# Patient Record
Sex: Male | Born: 1935 | Race: White | Hispanic: No
Health system: Southern US, Community
[De-identification: ages and names within clinical notes are randomized; demographics above are authoritative.]

---

## 2010-11-19 ENCOUNTER — Emergency Department: Payer: Self-pay | Admitting: Emergency Medicine

## 2012-01-07 ENCOUNTER — Emergency Department: Payer: Self-pay | Admitting: Emergency Medicine

## 2012-01-07 LAB — COMPREHENSIVE METABOLIC PANEL
Alkaline Phosphatase: 72 U/L (ref 50–136)
Bilirubin,Total: 0.5 mg/dL (ref 0.2–1.0)
Calcium, Total: 8.6 mg/dL (ref 8.5–10.1)
Chloride: 106 mmol/L (ref 98–107)
Co2: 28 mmol/L (ref 21–32)
Creatinine: 1.47 mg/dL — ABNORMAL HIGH (ref 0.60–1.30)
EGFR (African American): 46 — ABNORMAL LOW
EGFR (Non-African Amer.): 40 — ABNORMAL LOW
SGPT (ALT): 18 U/L (ref 12–78)
Total Protein: 7.3 g/dL (ref 6.4–8.2)

## 2012-01-07 LAB — CBC
HGB: 12.2 g/dL — ABNORMAL LOW (ref 13.0–18.0)
MCHC: 33.5 g/dL (ref 32.0–36.0)
Platelet: 182 10*3/uL (ref 150–440)
RBC: 4.16 10*6/uL — ABNORMAL LOW (ref 4.40–5.90)
RDW: 14.7 % — ABNORMAL HIGH (ref 11.5–14.5)

## 2012-01-07 LAB — CK TOTAL AND CKMB (NOT AT ARMC): CK, Total: 52 U/L (ref 35–232)

## 2012-12-30 ENCOUNTER — Inpatient Hospital Stay: Payer: Self-pay | Admitting: Family Medicine

## 2012-12-30 LAB — URINALYSIS, COMPLETE
Bilirubin,UR: NEGATIVE
Glucose,UR: NEGATIVE mg/dL (ref 0–75)
Ketone: NEGATIVE
Nitrite: POSITIVE
Protein: 30
WBC UR: 2 /HPF (ref 0–5)

## 2012-12-30 LAB — PRO B NATRIURETIC PEPTIDE: B-Type Natriuretic Peptide: 1674 pg/mL — ABNORMAL HIGH (ref 0–450)

## 2012-12-30 LAB — CBC WITH DIFFERENTIAL/PLATELET
Basophil #: 0 10*3/uL (ref 0.0–0.1)
Eosinophil #: 0.1 10*3/uL (ref 0.0–0.7)
Lymphocyte #: 0.6 10*3/uL — ABNORMAL LOW (ref 1.0–3.6)
MCHC: 32.6 g/dL (ref 32.0–36.0)
Monocyte #: 1 x10 3/mm (ref 0.2–1.0)
Neutrophil #: 12.2 10*3/uL — ABNORMAL HIGH (ref 1.4–6.5)
RDW: 15.4 % — ABNORMAL HIGH (ref 11.5–14.5)

## 2012-12-30 LAB — MAGNESIUM: Magnesium: 1.6 mg/dL — ABNORMAL LOW

## 2012-12-30 LAB — COMPREHENSIVE METABOLIC PANEL
Albumin: 2.7 g/dL — ABNORMAL LOW (ref 3.4–5.0)
Alkaline Phosphatase: 76 U/L (ref 50–136)
Anion Gap: 6 — ABNORMAL LOW (ref 7–16)
BUN: 25 mg/dL — ABNORMAL HIGH (ref 7–18)
Bilirubin,Total: 0.5 mg/dL (ref 0.2–1.0)
Chloride: 108 mmol/L — ABNORMAL HIGH (ref 98–107)
Co2: 26 mmol/L (ref 21–32)
EGFR (African American): 35 — ABNORMAL LOW
EGFR (Non-African Amer.): 30 — ABNORMAL LOW
Glucose: 125 mg/dL — ABNORMAL HIGH (ref 65–99)
SGPT (ALT): 25 U/L (ref 12–78)
Total Protein: 6.7 g/dL (ref 6.4–8.2)

## 2012-12-30 LAB — PROTIME-INR: Prothrombin Time: 14.5 secs (ref 11.5–14.7)

## 2012-12-30 LAB — TROPONIN I
Troponin-I: 0.29 ng/mL — ABNORMAL HIGH
Troponin-I: 0.99 ng/mL — ABNORMAL HIGH
Troponin-I: 0.87 ng/mL — ABNORMAL HIGH

## 2012-12-31 LAB — BASIC METABOLIC PANEL
Anion Gap: 7 (ref 7–16)
BUN: 24 mg/dL — ABNORMAL HIGH (ref 7–18)
Calcium, Total: 8.2 mg/dL — ABNORMAL LOW (ref 8.5–10.1)
Chloride: 111 mmol/L — ABNORMAL HIGH (ref 98–107)
EGFR (African American): 38 — ABNORMAL LOW
EGFR (Non-African Amer.): 33 — ABNORMAL LOW
Glucose: 87 mg/dL (ref 65–99)
Osmolality: 285 (ref 275–301)
Potassium: 4.2 mmol/L (ref 3.5–5.1)

## 2012-12-31 LAB — CBC WITH DIFFERENTIAL/PLATELET
Basophil #: 0.1 10*3/uL (ref 0.0–0.1)
Basophil %: 0.8 %
Eosinophil #: 0.7 10*3/uL (ref 0.0–0.7)
HGB: 9.7 g/dL — ABNORMAL LOW (ref 13.0–18.0)
Lymphocyte #: 1.3 10*3/uL (ref 1.0–3.6)
MCH: 28.4 pg (ref 26.0–34.0)
MCHC: 33.4 g/dL (ref 32.0–36.0)
Monocyte #: 0.7 x10 3/mm (ref 0.2–1.0)
Neutrophil #: 6.3 10*3/uL (ref 1.4–6.5)
Platelet: 159 10*3/uL (ref 150–440)
RBC: 3.43 10*6/uL — ABNORMAL LOW (ref 4.40–5.90)
RDW: 16 % — ABNORMAL HIGH (ref 11.5–14.5)
WBC: 8.9 10*3/uL (ref 3.8–10.6)

## 2012-12-31 LAB — LIPID PANEL
Cholesterol: 109 mg/dL (ref 0–200)
Triglycerides: 55 mg/dL (ref 0–200)

## 2013-01-01 LAB — BASIC METABOLIC PANEL
BUN: 27 mg/dL — ABNORMAL HIGH (ref 7–18)
Calcium, Total: 8.1 mg/dL — ABNORMAL LOW (ref 8.5–10.1)
Chloride: 111 mmol/L — ABNORMAL HIGH (ref 98–107)
Creatinine: 1.81 mg/dL — ABNORMAL HIGH (ref 0.60–1.30)
EGFR (African American): 36 — ABNORMAL LOW
Glucose: 82 mg/dL (ref 65–99)
Sodium: 141 mmol/L (ref 136–145)

## 2013-01-01 LAB — HEMOGLOBIN: HGB: 9.5 g/dL — ABNORMAL LOW (ref 13.0–18.0)

## 2013-01-02 LAB — BASIC METABOLIC PANEL
Anion Gap: 4 — ABNORMAL LOW (ref 7–16)
BUN: 23 mg/dL — ABNORMAL HIGH (ref 7–18)
Chloride: 114 mmol/L — ABNORMAL HIGH (ref 98–107)
Co2: 24 mmol/L (ref 21–32)
Creatinine: 1.49 mg/dL — ABNORMAL HIGH (ref 0.60–1.30)
Glucose: 89 mg/dL (ref 65–99)
Osmolality: 286 (ref 275–301)

## 2013-01-03 LAB — BASIC METABOLIC PANEL
BUN: 20 mg/dL — ABNORMAL HIGH (ref 7–18)
Co2: 25 mmol/L (ref 21–32)
Creatinine: 1.46 mg/dL — ABNORMAL HIGH (ref 0.60–1.30)
EGFR (African American): 46 — ABNORMAL LOW
EGFR (Non-African Amer.): 40 — ABNORMAL LOW
Glucose: 93 mg/dL (ref 65–99)
Osmolality: 285 (ref 275–301)
Sodium: 142 mmol/L (ref 136–145)

## 2013-01-05 ENCOUNTER — Ambulatory Visit: Payer: Self-pay | Admitting: Nurse Practitioner

## 2013-01-10 ENCOUNTER — Emergency Department: Payer: Self-pay | Admitting: Emergency Medicine

## 2013-01-10 ENCOUNTER — Ambulatory Visit: Payer: Self-pay | Admitting: Internal Medicine

## 2013-01-10 LAB — COMPREHENSIVE METABOLIC PANEL
Albumin: 2.5 g/dL — ABNORMAL LOW (ref 3.4–5.0)
Alkaline Phosphatase: 134 U/L (ref 50–136)
BUN: 17 mg/dL (ref 7–18)
Bilirubin,Total: 0.4 mg/dL (ref 0.2–1.0)
Calcium, Total: 8.5 mg/dL (ref 8.5–10.1)
Co2: 29 mmol/L (ref 21–32)
Creatinine: 1.44 mg/dL — ABNORMAL HIGH (ref 0.60–1.30)
Glucose: 96 mg/dL (ref 65–99)
Potassium: 4.6 mmol/L (ref 3.5–5.1)
Sodium: 136 mmol/L (ref 136–145)
Total Protein: 6.1 g/dL — ABNORMAL LOW (ref 6.4–8.2)

## 2013-01-10 LAB — CBC
HCT: 30.5 % — ABNORMAL LOW (ref 40.0–52.0)
HGB: 10.2 g/dL — ABNORMAL LOW (ref 13.0–18.0)
MCH: 27.6 pg (ref 26.0–34.0)
MCV: 82 fL (ref 80–100)
Platelet: 263 10*3/uL (ref 150–440)
RBC: 3.71 10*6/uL — ABNORMAL LOW (ref 4.40–5.90)
RDW: 16.3 % — ABNORMAL HIGH (ref 11.5–14.5)

## 2013-02-04 ENCOUNTER — Ambulatory Visit: Payer: Self-pay | Admitting: Nurse Practitioner

## 2013-05-05 ENCOUNTER — Ambulatory Visit: Payer: Self-pay | Admitting: Nurse Practitioner

## 2013-05-05 DEATH — deceased

## 2014-06-27 NOTE — Discharge Summary (Signed)
PATIENT NAMOretha Caprice:  Marlar, Haytham MR#:  [id number] DATE OF BIRTH:  07/15/1913  DATE OF ADMISSION:  12/30/2012    Addendum  DISCHARGE DIAGNOSES: 1.  Acute renal failure secondary to acute tubular necrosis.  2.  Acute respiratory failure secondary to underlying chronic obstructive pulmonary disease that is improved.  3.  History of accelerated hypertension that is improved.    ____________________________ Marisue IvanKanhka Jasmon Mattice, MD kl:dmm D: 01/03/2013 11:54:16 ET T: 01/03/2013 11:59:43 ET JOB#: [id number]  cc: Marisue IvanKanhka Sevilla Murtagh, MD, <Dictator> Marisue IvanKANHKA Marivel Mcclarty MD ELECTRONICALLY SIGNED 01/15/2013 16:54

## 2014-06-27 NOTE — Discharge Summary (Signed)
PATIENT NAMOretha Graham:  Graham, [name] MR#:  [id number] DATE OF BIRTH:  06/03/1913  DATE OF ADMISSION:  12/30/2012 DATE OF DISCHARGE: 01/03/2013   DISCHARGE DIAGNOSES:  1.  Intracranial head bleed, status post fall. 2.  Hypertension.  3.  Acute renal failure.  4.  Chronic obstructive pulmonary disease/pulmonary fibrosis.  5.  Gastroesophageal reflux disease.  6.  Anxiety.   DISCHARGE MEDICATIONS:  1.  Paxil 20 mg p.o. at bedtime.  2.  Tylenol extra strength 500 mg 2 tabs q.6 hours as needed not to exceed 4 grams in 1 day.  3.  Prilosec 20 mg p.o. b.i.d.  4.  Carvedilol 3.125 mg p.o. b.i.d.  5.  Losartan 25 mg p.o. b.i.d.   CONSULTS: None.   PROCEDURES: None.   PERTINENT LABORATORY AND STUDIES: Head CT showed no acute changes from initial intracranial bleed on the right pons upon admission. Echocardiogram within normal limits. On the day of discharge, hemoglobin 9.3, sodium 142, potassium 3.9, creatinine 1.46, glucose of 93. Modified barium swallow study did show some deficiencies. Recommend a pureed diet per speech therapy. The patient was evaluated.   BRIEF HOSPITAL COURSE:  1.  Intracranial bleed. The patient initially came in status post fall and was found to have an intracranial bleed to the right pons seen on a head CT. Neurologically stable. Hemoglobin stable. Repeat CT of the head showed no significant changes from prior study. Plan to provide supportive care per family request. No invasive intervention. The patient's family denied transfer to a different facility for neurosurgery. Our plan is to repeat a CT scan in a week unless the patient has acute neurological deficits and then we will repeat a CT or MRI immediately. Plan to control the blood pressure as best as possible. We restarted his home regimen. We will keep his blood pressure less than 160/90.  2.  Risk for aspiration pneumonia. The patient initially came with concern for aspiration pneumonia. He was placed on IV antibiotics,  azithromycin and ceftriaxone. Repeat chest x-ray did not show any signs of pneumonia. The patient did not complain of any cough or worsening dyspnea during hospital stay. He does have underlying COPD and pulmonary fibrosis; therefore, the antibiotics were discontinued after 3 days of treatment, which is as per home regimen.  3.  GERD. Continue his home regimen of Prilosec.   DISPOSITION: The patient is in stable condition and is to be discharged to a skilled nursing facility where he will need further nursing care, PT and OT. The patient is not safe to return home. He will follow-up with Dr. Burnadette PopLinthavong within 1 to 2 days of discharge from the skilled nursing facility.  ____________________________ [name] IvanKanhka Misha Antonini, MD kl:aw D: 01/03/2013 08:24:02 ET T: 01/03/2013 08:52:38 ET JOB#: [id number]  cc: [name] IvanKanhka Allisyn Kunz, MD, <Dictator> [name] IvanKANHKA Eliab Closson MD ELECTRONICALLY SIGNED 01/15/2013 16:54

## 2014-06-27 NOTE — H&P (Signed)
[name], [name] MR#:  [id number] DATE OF BIRTH:  [birth year]  DATE OF ADMISSION:  12/30/2012  REASON FOR ADMISSION: Acute respiratory failure, intracranial bleeding.   PRIMARY CARE PHYSICIAN: Marisue Ivan, MD    REFERRING PHYSICIAN: Dr. Bayard Males  HISTORY OF PRESENT ILLNESS: This is a very nice 81 year old gentleman with history of hypertension, GERD, previous prostate cancer, benign paroxysmal vertigo, who comes after a fall at 10:30 p.m. last night. The patient was getting up going to the bathroom and fell straight back. He hit his head against the doorknob and his shoulder as well. He could not get up. The family was concerned and the patient started developing acute shortness of breath at that moment. They brought him to the Emergency Department where he was evaluated. CT of the head showed intracerebral bleeding on the right pons. The patient has not had any significant changes in his mental status. He was very weak and dehydrated, received some IV fluids and has started feeling better. As for his general health, the patient has had a significant decline with the past two months, not eating or drinking much. He does not have any dementia. He is actually very with it mentally. He has been declining his motor abilities; now he has to ambulate with a walker. The patient apparently has been getting really short of breath whenever he moves from his bedroom to the bathroom, and then from the bathroom to the kitchen he has to stop several times on the way to make sure that he catches his air.   Overall, the patient has been dealing with issues of blood pressure, but his general state of health has been good up until the last couple of months. Apparently, he has had at least a 10 pound decrease of weight. He has not been eating or drinking much.   The patient is admitted for observation and  progression of this bleeding, and also for treatment of his acute respiratory failure likely  secondary to pneumonitis/pneumonia. The patient's family is okay with the patient not being transferred and staying here mostly for not aggressive treatment. The patient and the family are aware of the level of care that we can provide over here.   PAST MEDICAL HISTORY:  1. Hypertension.  2. GERD.  3. Hiatal hernia.  4. Dysphagia.  5. Prostate cancer.  6. Benign positional vertigo.  7. Macular degeneration, legally blind.   PAST SURGICAL HISTORY:  1. Hiatal hernia repair.  2. Radical prostatectomy 30 years ago.  3. Back surgery x 2.   ALLERGIES: No known drug allergies.   FAMILY HISTORY: Unknown. The patient states that most of his family died from old age.   SOCIAL HISTORY: Used to smoke. Quit over 30 years ago. He used to drink heavily, but quit over 30 years ago. He lives at his own house, mostly by himself with 24 hours/7 help of his family or his caregivers.   MEDICATIONS: Prilosec 20 mg twice daily, losartan 25 mg twice daily, meclizine 25 mg twice daily, Coreg 3.125 mg twice daily, paroxetine 40 mg at night.    PHYSICAL EXAMINATION: VITAL SIGNS: Blood pressure 157/69, pulse 81 respiratory rate 25 to 30, temperature 99.2. Oxygen saturation 88% on nasal cannula, now oxygenating 98% on non-rebreather.  GENERAL: The patient is awake, oriented x 3, in no acute distress. Positive respiratory distress. Hemodynamically he is stable, looks dehydrated.  HEENT: Pupils are equal and reactive. Extraocular movements are intact. Mucosa moist. Anicteric sclerae. Pink conjunctivae. No  oral lesions. No oropharyngeal exudates.  NECK: Supple. No JVD. No thyromegaly. No adenopathy. No carotid bruits. No rigidity.  CARDIOVASCULAR: Regular rate and rhythm. No murmurs, rubs or gallops are appreciated.  LUNGS: Coarse with crackles on both bases and lateral rhonchi. Positive use of accessory muscles. No dullness to percussion.  ABDOMEN: Soft, nontender, nondistended. No hepatosplenomegaly. No masses.  Bowel sounds are positive.  GENITAL: Negative for external lesions.    EXTREMITIES: No edema, cyanosis or clubbing. Pulses +2. Capillary refill about 5 seconds.  NEUROLOGIC: Cranial nerves II through XII intact. Strength is 4/5 all four extremities. No focal findings. No findings of increased intracranial or intracerebral pressure.  PSYCHIATRIC: The patient looks oriented. He is aware of the situation. No agitation.  LYMPHATIC: Negative for lymphadenopathy in neck or supraclavicular areas.  SKIN: Decreased turgor, slightly dehydrated.  MUSCULOSKELETAL: No significant joint effusions or joint deformity.  RESULTS: CT scan mentioned above.   Chest x-ray: Bilateral diffuse infiltrates.   Creatinine is 1.85, previous creatinine 1.4 one year ago. Glucose 125, potassium 4.4.  Troponin is 0.29, white blood count is 13.8, hemoglobin is 10, platelets 185. The patient does not have a PT/INR; I am going to get one STAT. His magnesium is 1.6.  Urinalysis is back. White count is 2,  no red blood cells, positive nitrates, but not really signs of infection.   EKG: Normal sinus rhythm   ASSESSMENT AND PLAN: An 81 year old gentleman with hypertension, GERD, who comes after fall, presenting with intracerebral hemorrhage and acute respiratory failure.  1.  Status post fall. The patient has been very weak, declining significantly, and we are going to get physical therapy and occupational therapy evaluation.  2. Intracerebral bleeding. This is the main problem that he has right now. He has a pons hemorrhage. We need to follow up with CT scan. We are going to order one for tomorrow morning.  The patient's family does not want any aggressive treatment, for what I think 24 hours for now repeated CT scan should be appropriate unless there is any changes on his mental status or neurologic exam. If that changes, we are going to do a STAT CT, but for now we are just going to repeat tomorrow for evaluation of the progression.  There are no significant signs of increased intracerebral pressure, for what we do not have to be aggressive in management. His blood pressure should be kept below a mean arterial pressure (MAP) of 110, below systolic of 160 over 90s. Add on labetalol and hydralazine p.r.n. Prevent hyperglycemia. Use of isotonic saline to prevent cerebral edema. Do not use hypotonic solutions. Monitor electrolytes closely. The patient does not need to be transferred to another facility, as the family is requesting non-aggressive care and only expectant management. Neuro checks q.4 hours.  3. Hypertension. Continue management with Coreg. Hold losartan as the patient is in acute  kidney failure. See above parameters. 4. Acute kidney injury. The patient has chronic kidney disease. Last creatinine was 1.47. His GFR is  40. Today his GFR is 30 with a creatinine of 1.85. We are going to hydrate.  5. Acute respiratory failure. The patient is likely having a pneumonitis versus new beginnings of pneumonia. The patient has a fever, has a respiratory rate of 30. His white count is slightly elevated at 13. We are going to treat with antibiotics. He was started on Levaquin. Since has presentation with acute kidney failure, I am going to change his antibiotics to Rocephin and azithromycin. The patient  needs blood cultures before the antibiotics.  6. Check INR to correct any coagulopathy if necessary.  7. Dysphasia,  speech therapy.  8. Gastroesophageal reflux disease.  Continue proton pump inhibitor.  9. Increased BNP. Consider this as a possibility of congestive heart failure. We are going to get an echo. At this moment I am not clear if he is in full congestive heart failure. The patient definitely is dehydrated, for what I think he needs fluids. Monitor closely. Use Lasix if necessary.  10. Hypomagnesemia.   11. Increased troponin, likely secondary to intracranial bleeding. No signs of ST depression or elevation. Follow up cardiac  enzymes. Consult cardiology if necessary.  12. The patient is a DO NOT RESUSCITATE.   TIME SPENT: I spent about 60 minutes with this patient in critical care time, as he has significant problems including respiratory failure, intracerebral bleeding followed by significant concerns for cardiovascular collapse, respiratory failure, cardiac arrest and cerebral edema.   We transferred the care to Dr. Burnadette Pop.    ____________________________ Felipa Furnace, MD rsg:sg D: 12/30/2012 08:33:00 ET T: 12/30/2012 09:08:48 ET JOB#: [id number]  cc: Felipa Furnace, MD, <Dictator> Olando Willems Juanda Chance MD ELECTRONICALLY SIGNED 01/18/2013 23:46

## 2015-04-19 IMAGING — CT CT CERVICAL SPINE WITHOUT CONTRAST
1 series · 12 of 14 positions shown, 15 images · non-contrast
Comparison: none

REASON FOR EXAM: fall with occipital trauma
COMMENTS:

PROCEDURE:     CT  - CT CERVICAL SPINE WO  - December 30, 2012  [DATE]
RESULT:     CT cervical spine 12/30/2012
Comparison 01/08/2012
TECHNIQUE: Helical noncontrasted 2 mm sections were obtained. Multiplanar
reconstructions utilizing high-definition bone algorithm.

[Series 4: axial · axial · 0.33mm/px · z∈[-295,-146]mm · 12 of 99 slices shown, 15 images]
[im 8/99  soft-tissue]
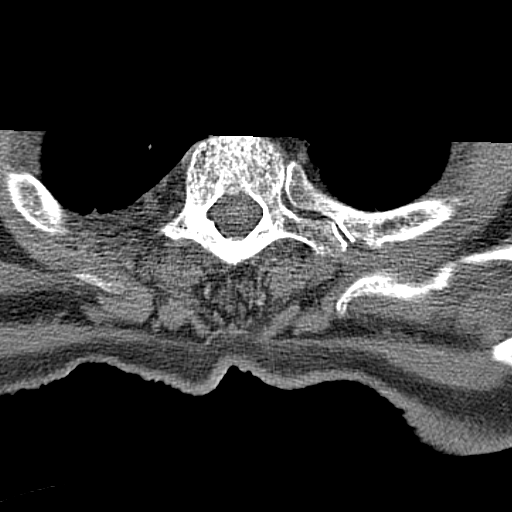
[im 8/99  bone]
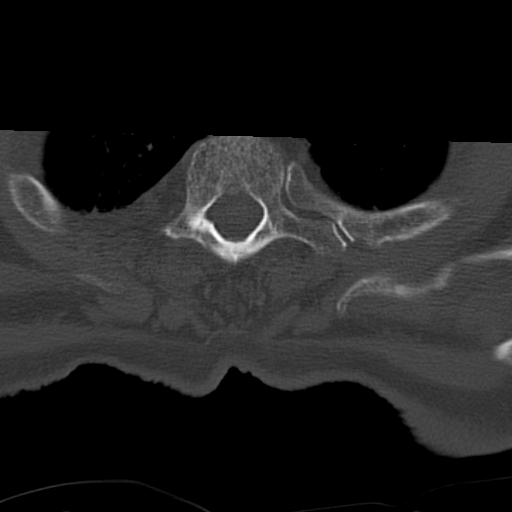
[im 16/99  bone]
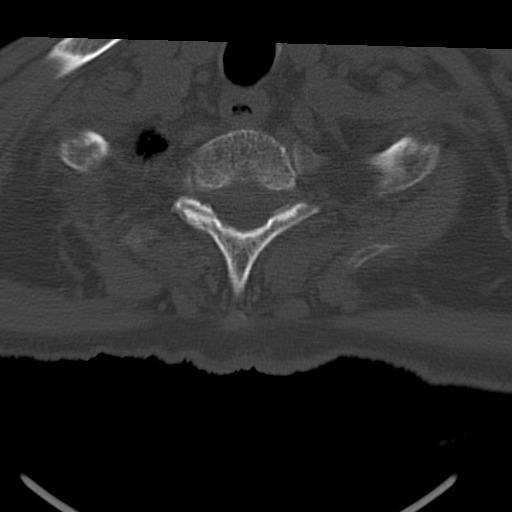
[im 23/99  bone]
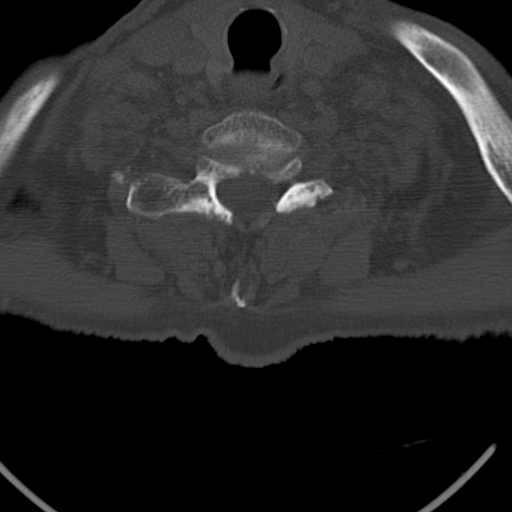
[im 31/99  bone]
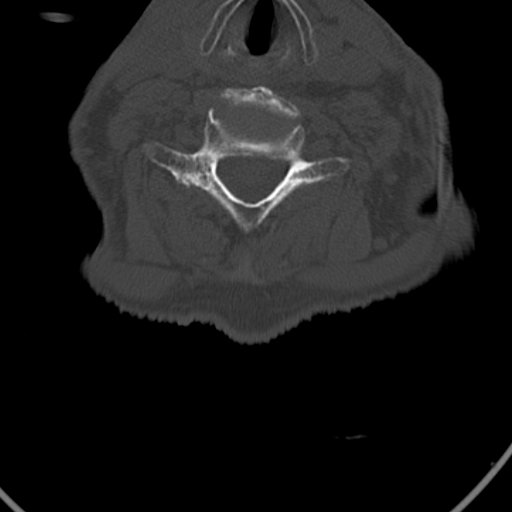
[im 38/99  soft-tissue]
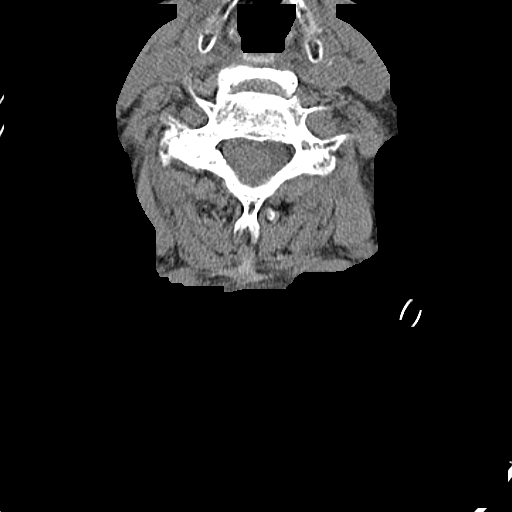
[im 38/99  bone]
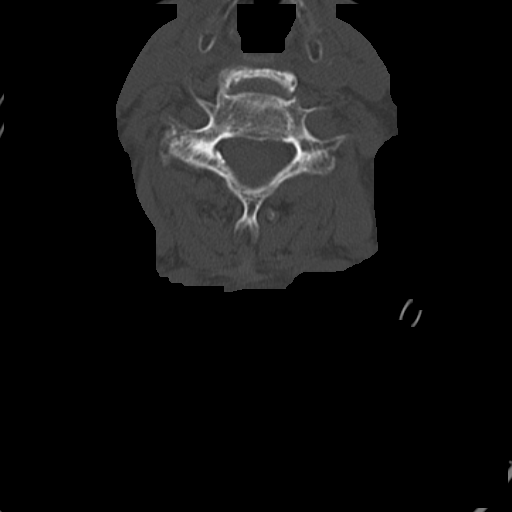
[im 46/99  bone]
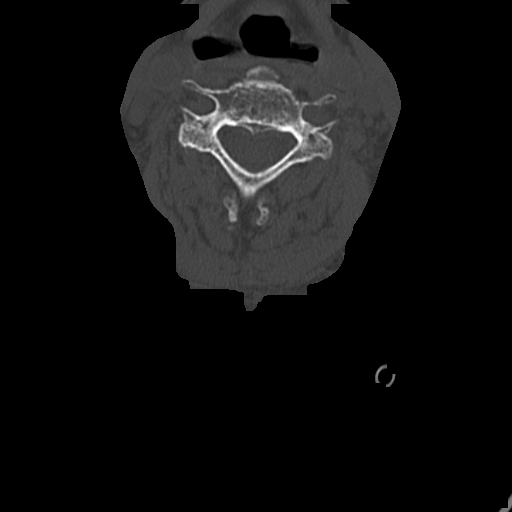
[im 53/99  bone]
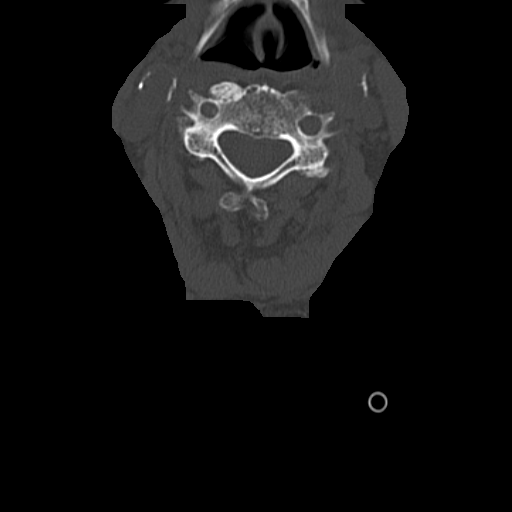
[im 61/99  bone]
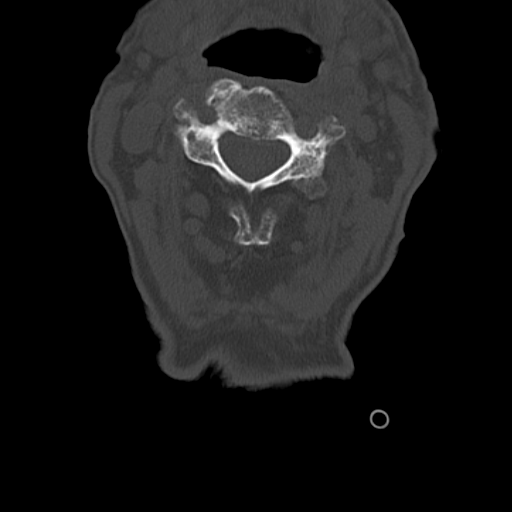
[im 68/99  soft-tissue]
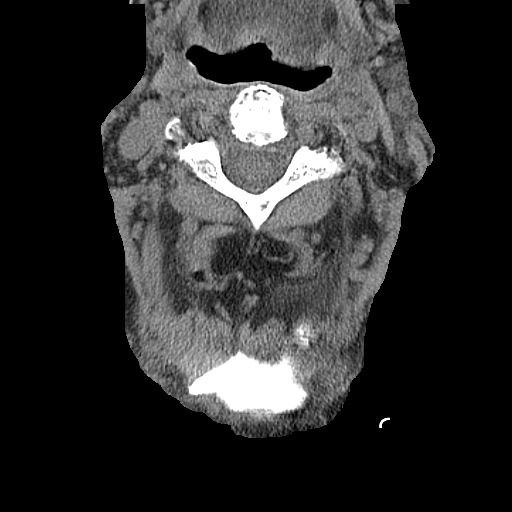
[im 68/99  bone]
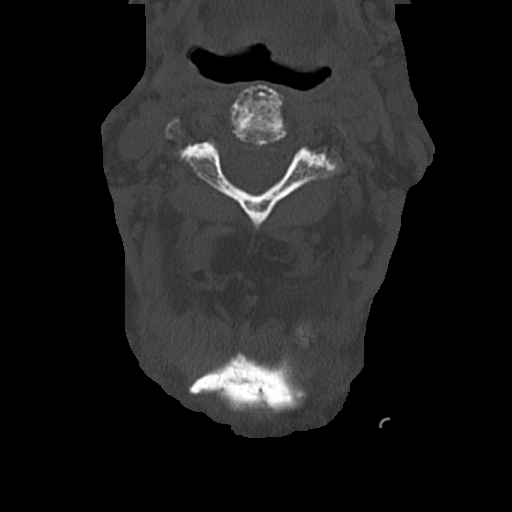
[im 76/99  bone]
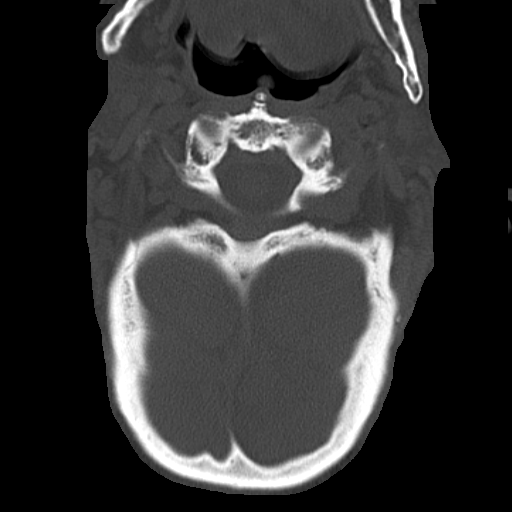
[im 83/99  bone]
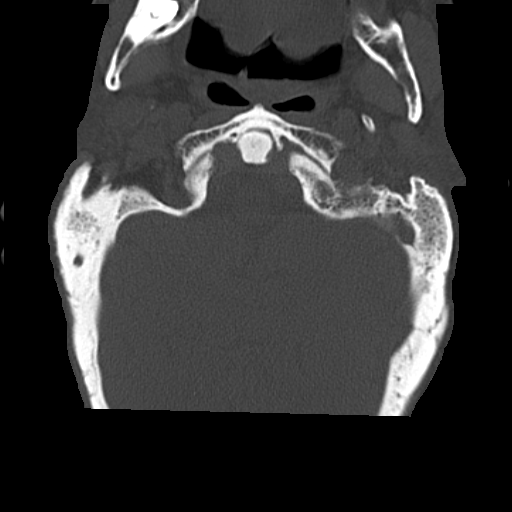
[im 91/99  bone]
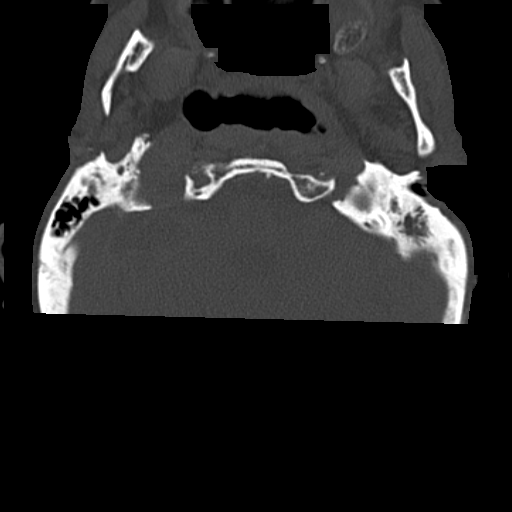

[12 of 14 positions shown; findings below may reference images not displayed]

FINDINGS: Severe degenerative changes are appreciated at the atlanto-dens
articulation. There is no evidence of acute fracture or dislocation.
Moderate multilevel degenerative changes. No evidence of canal stenosis.
There is no evidence of soft tissue swelling in the prevertebral region.
Diffuse idiopathic skeletal hyperostosis is appreciated.
IMPRESSION: No evidence of acute osseous maladies.
2. Dr. Rinnah of the emergency department was informed of these findings per
a preliminary faxed report.

## 2015-04-30 IMAGING — US US EXTREM UP VENOUS*R*
1 series · 14 of 24 positions shown · non-contrast
Comparison: None.

CLINICAL DATA: Right upper extremity swelling after intravenous
line placement

EXAM:
US EXTREM UP VENOUS RIGHT
TECHNIQUE: Gray-scale sonography with graded compression, as well as color
Doppler and duplex ultrasound were performed to evaluate the upper
extremity deep venous system from the level of the subclavian vein
and including the jugular, axillary, basilic and upper cephalic
vein. Spectral Doppler was utilized to evaluate flow at rest and
with distal augmentation maneuvers.

[Series 1: us extrem up venous*right* · 0.06mm/px · 14 of 35 slices shown]
[im 1/35]
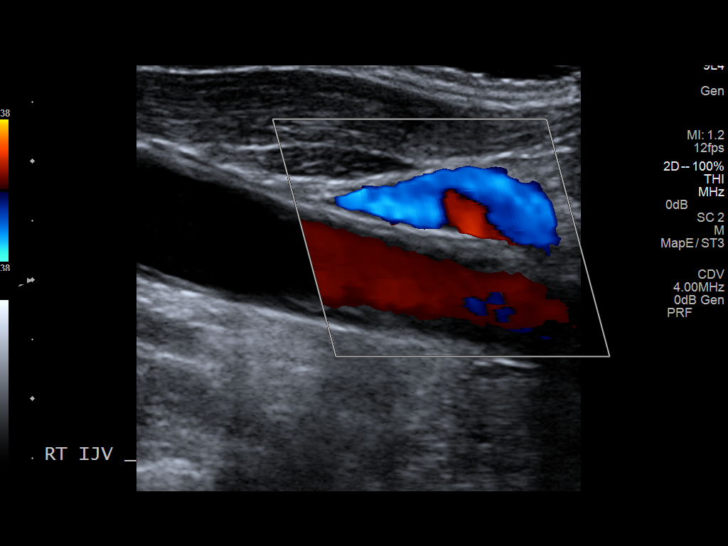
[im 3/35]
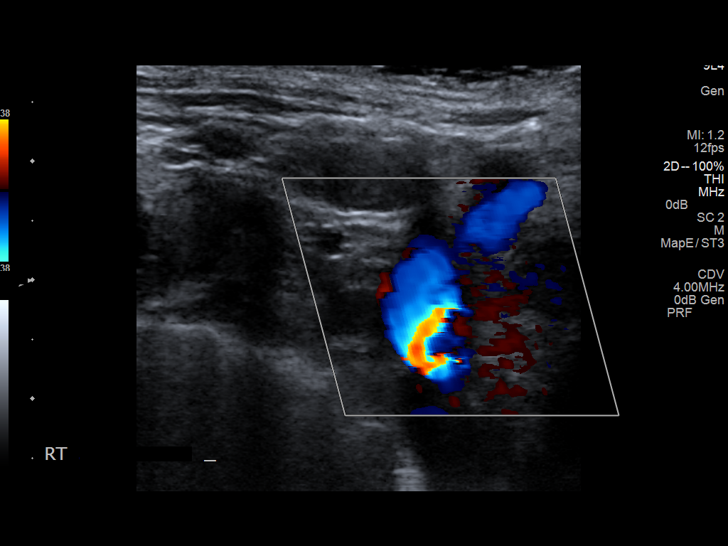
[im 6/35]
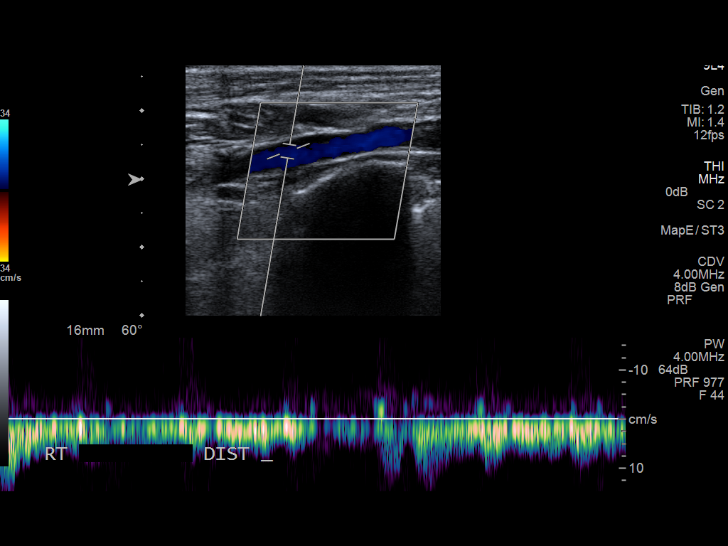
[im 9/35]
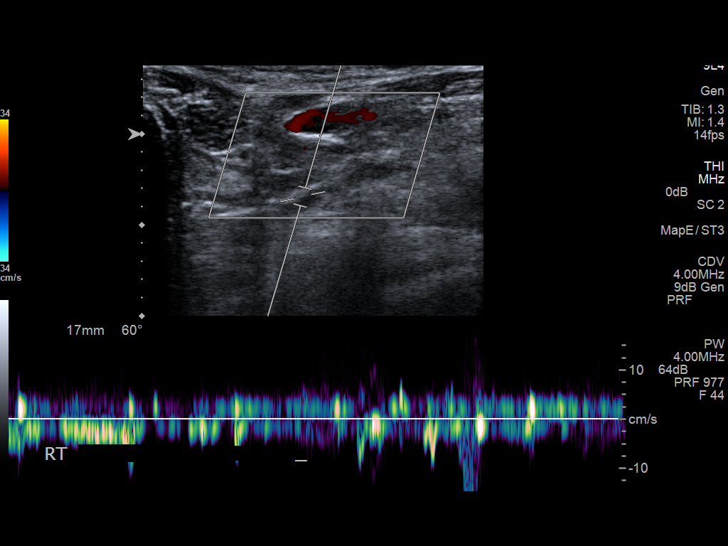
[im 11/35]
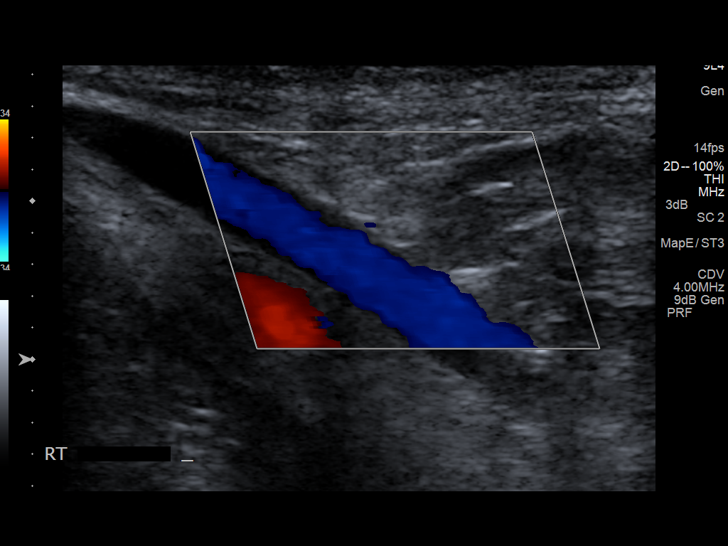
[im 14/35]
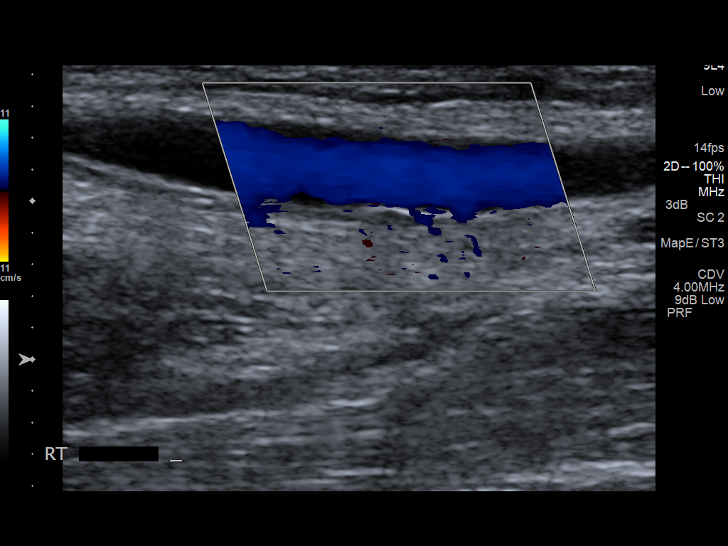
[im 17/35]
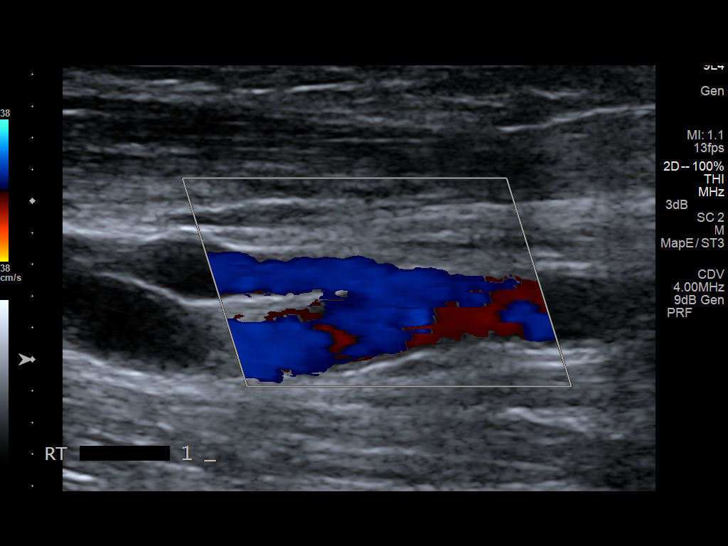
[im 18/35]
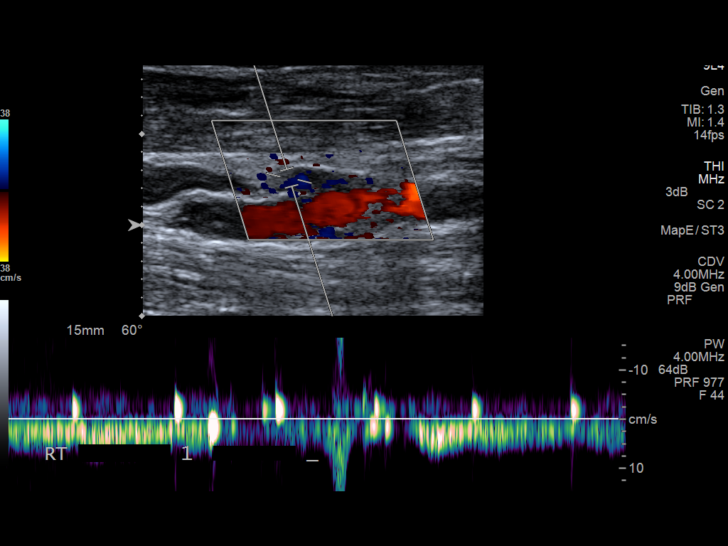
[im 21/35]
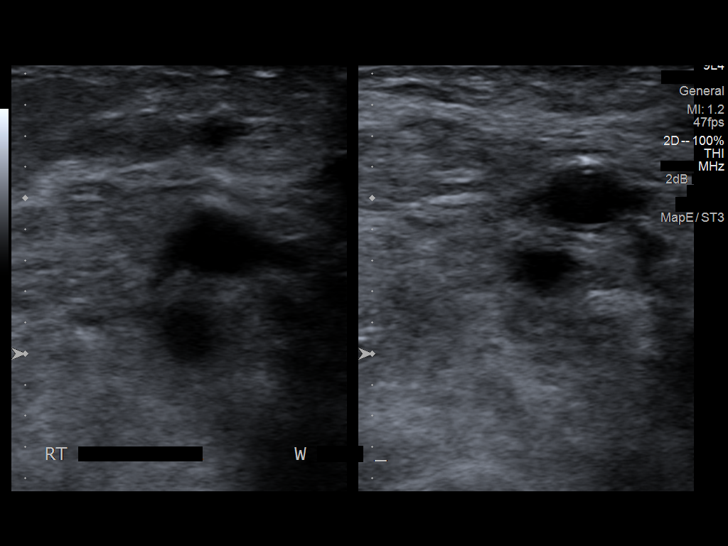
[im 24/35]
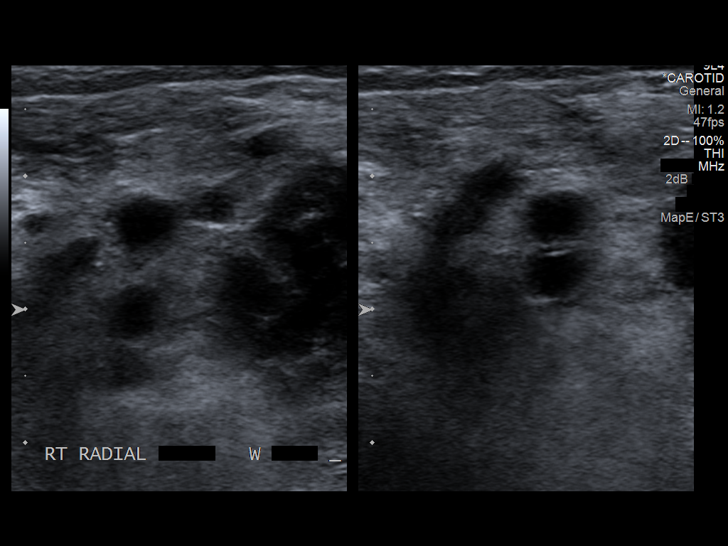
[im 27/35]
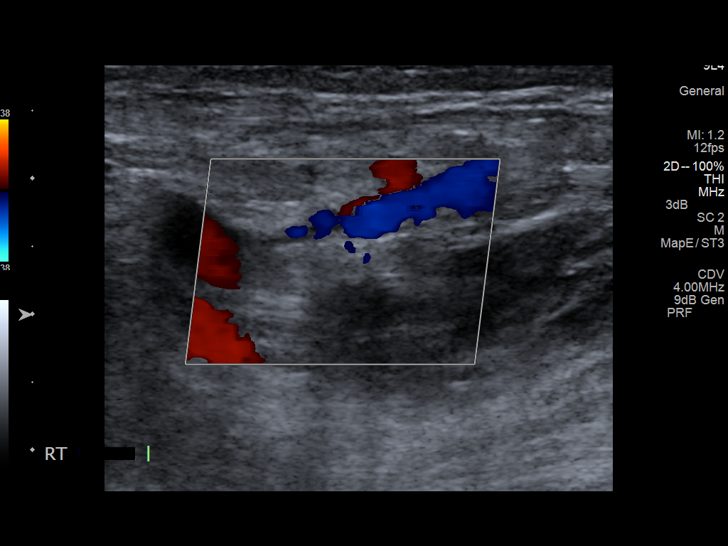
[im 29/35]
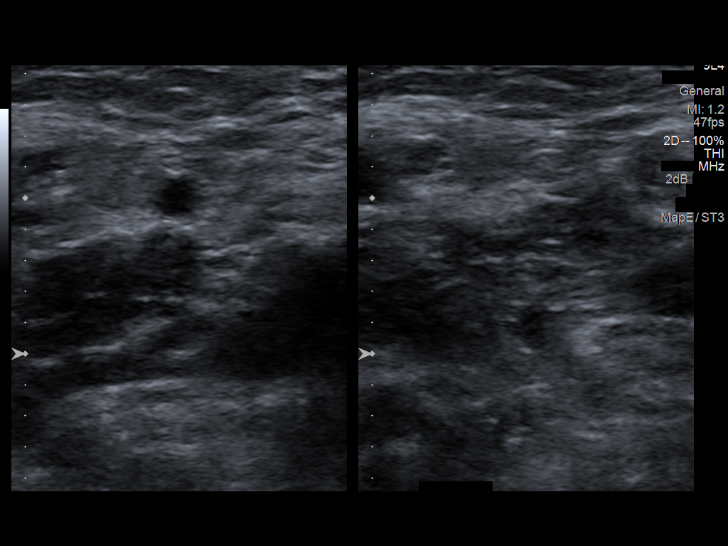
[im 32/35]
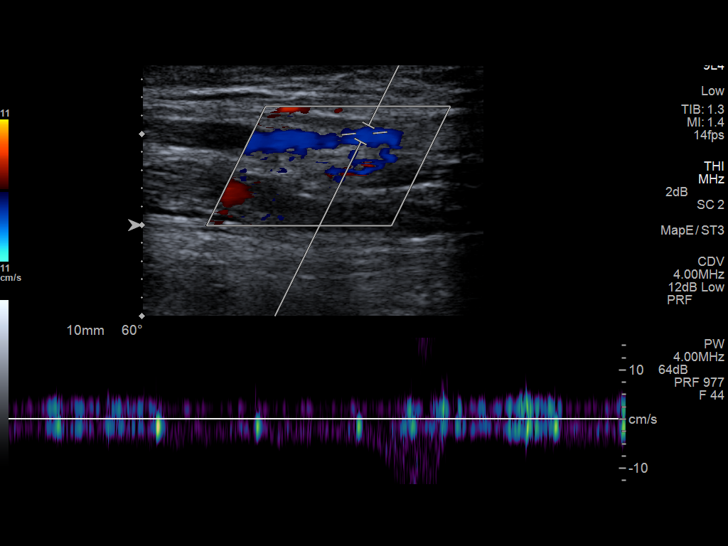
[im 35/35]
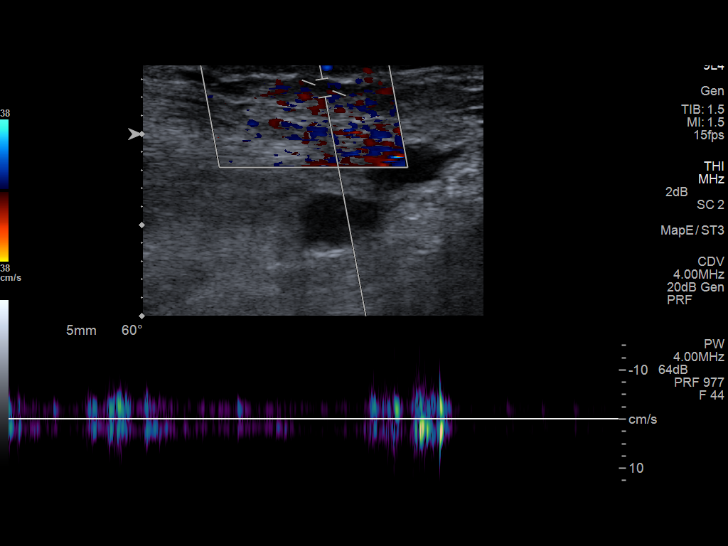

[14 of 24 positions shown; findings below may reference images not displayed]

FINDINGS: Thrombus within deep veins:  None visualized.

Compressibility of deep veins:  Normal.

Duplex waveform respiratory phasicity:  Normal.

Duplex waveform response to augmentation:  Normal.

Venous reflux:  None visualized.

Other findings:  None visualized.
IMPRESSION: No sonographic evidence for right upper extremity deep venous
thrombosis.
